# Patient Record
Sex: Male | Born: 1955 | Race: White | Hispanic: No | Marital: Single | Smoking: Former smoker
Health system: Southern US, Community
[De-identification: ages and names within clinical notes are randomized; demographics above are authoritative.]

## PROBLEM LIST (undated history)

## (undated) DIAGNOSIS — I10 Essential (primary) hypertension: Secondary | ICD-10-CM

## (undated) DIAGNOSIS — E119 Type 2 diabetes mellitus without complications: Secondary | ICD-10-CM

## (undated) DIAGNOSIS — J449 Chronic obstructive pulmonary disease, unspecified: Secondary | ICD-10-CM

## (undated) HISTORY — DX: Chronic obstructive pulmonary disease, unspecified: J44.9

---

## 2017-02-21 ENCOUNTER — Emergency Department (HOSPITAL_COMMUNITY)
Admission: EM | Admit: 2017-02-21 | Discharge: 2017-02-21 | Disposition: A | Discharge status: Home / Self Care | Payer: Medicare Other | Attending: Emergency Medicine | Admitting: Emergency Medicine

## 2017-02-21 ENCOUNTER — Encounter (HOSPITAL_COMMUNITY): Payer: Self-pay

## 2017-02-21 ENCOUNTER — Emergency Department (HOSPITAL_COMMUNITY): Payer: Medicare Other

## 2017-02-21 DIAGNOSIS — Z87891 Personal history of nicotine dependence: Secondary | ICD-10-CM | POA: Insufficient documentation

## 2017-02-21 DIAGNOSIS — Y999 Unspecified external cause status: Secondary | ICD-10-CM | POA: Diagnosis not present

## 2017-02-21 DIAGNOSIS — E119 Type 2 diabetes mellitus without complications: Secondary | ICD-10-CM | POA: Insufficient documentation

## 2017-02-21 DIAGNOSIS — Z79899 Other long term (current) drug therapy: Secondary | ICD-10-CM | POA: Diagnosis not present

## 2017-02-21 DIAGNOSIS — S40011A Contusion of right shoulder, initial encounter: Secondary | ICD-10-CM | POA: Insufficient documentation

## 2017-02-21 DIAGNOSIS — Y9301 Activity, walking, marching and hiking: Secondary | ICD-10-CM | POA: Insufficient documentation

## 2017-02-21 DIAGNOSIS — I10 Essential (primary) hypertension: Secondary | ICD-10-CM | POA: Insufficient documentation

## 2017-02-21 DIAGNOSIS — W01198A Fall on same level from slipping, tripping and stumbling with subsequent striking against other object, initial encounter: Secondary | ICD-10-CM | POA: Diagnosis not present

## 2017-02-21 DIAGNOSIS — Y929 Unspecified place or not applicable: Secondary | ICD-10-CM | POA: Insufficient documentation

## 2017-02-21 DIAGNOSIS — S4991XA Unspecified injury of right shoulder and upper arm, initial encounter: Secondary | ICD-10-CM | POA: Diagnosis present

## 2017-02-21 DIAGNOSIS — W010XXA Fall on same level from slipping, tripping and stumbling without subsequent striking against object, initial encounter: Secondary | ICD-10-CM

## 2017-02-21 HISTORY — DX: Essential (primary) hypertension: I10

## 2017-02-21 HISTORY — DX: Type 2 diabetes mellitus without complications: E11.9

## 2017-02-21 NOTE — Discharge Instructions (Signed)
Apply ice several times a day. Take acetaminophen or ibuprofen as needed for pain.  Twice a day, put the shoulder through as full a range of motion as you can. This is to prevent a frozen shoulder.  See Dr. Margo AyeHall to arrange for physical therapy.

## 2017-02-21 NOTE — ED Triage Notes (Signed)
Pt states he slipped on wet ground on Tuesday morning, fell onto his right shoulder, states pain with abduction of same

## 2017-02-21 NOTE — ED Provider Notes (Signed)
[facility] EMERGENCY DEPARTMENT Provider Note   CSN: [id number] Arrival date & time: 02/21/17  0351     History   Chief Complaint Chief Complaint  Patient presents with  . Shoulder Injury    fall    HPI Saintclair HalstedWilliam Dominguez is a 61 y.o. male.  The history is provided by the patient.  Yesterday, he slipped on wet ground and fell landing on his right shoulder. He is complaining of pain in that shoulder since then. He denies other injury. He is not on any anticoagulants. He complains of pain in his right shoulder when he tries to move it. There is no pain at rest. He initially had very poor strength in his right hand, but that is come back to normal. He denies numbness or tingling.  Past Medical History:  Diagnosis Date  . Diabetes mellitus without complication (HCC)   . Hypertension     There are no active problems to display for this patient.   History reviewed. No pertinent surgical history.     Home Medications    Prior to Admission medications   Medication Sig Start Date End Date Taking? Authorizing Provider  lisinopril (PRINIVIL,ZESTRIL) 10 MG tablet Take 10 mg by mouth daily.   Yes [provider]  metoprolol succinate (TOPROL-XL) 50 MG 24 hr tablet Take 50 mg by mouth daily. Take with or immediately following a meal.   Yes [provider]    Family History No family history on file.  Social History Social History  Substance Use Topics  . Smoking status: Former Games developermoker  . Smokeless tobacco: Never Used  . Alcohol use No     Allergies   Patient has no known allergies.   Review of Systems Review of Systems  All other systems reviewed and are negative.    Physical Exam Updated Vital Signs BP (!) 155/95 (BP Location: Left Arm)   Pulse 76   Temp 97.6 F (36.4 C) (Oral)   Resp 20   Ht 5\' 11"  (1.803 m)   Wt 111.1 kg (245 lb)   SpO2 99%   BMI 34.17 kg/m   Physical Exam  Nursing note and vitals reviewed.  61 year old male,  resting comfortably and in no acute distress. Vital signs are significant for hypertension. Oxygen saturation is 99%, which is normal. Head is normocephalic and atraumatic. PERRLA, EOMI. Oropharynx is clear. Neck is nontender and supple without adenopathy or JVD. Back is nontender and there is no CVA tenderness. Lungs are clear without rales, wheezes, or rhonchi. Chest is nontender. Heart has regular rate and rhythm without murmur. Abdomen is soft, flat, nontender without masses or hepatosplenomegaly and peristalsis is normoactive. Extremities: No swelling or deformity of the right shoulder. Minimal tenderness to palpation. There is moderate pain on passive range of motion. Range of motion is mildly limited in all directions. Neurovascular exam is intact with strong radial pulse, normal sensation, normal strength, prompt capillary refill. Skin is warm and dry without rash. Neurologic: Mental status is normal, cranial nerves are intact, there are no motor or sensory deficits.  ED Treatments / Results   Radiology Dg Shoulder Right  Result Date: 02/21/2017 CLINICAL DATA:  Slip and fall injury on Tuesday morning. Pain with abduction. EXAM: RIGHT SHOULDER - 2+ VIEW COMPARISON:  None. FINDINGS: There is no evidence of fracture or dislocation. There is no evidence of arthropathy or other focal bone abnormality. Soft tissues are unremarkable. IMPRESSION: Negative. Electronically Signed   By: Marisa CyphersWilliam  Stevens M.D.  On: 02/21/2017 04:39    Procedures Procedures (including critical care time)  Medications Ordered in ED Medications - No data to display   Initial Impression / Assessment and Plan / ED Course  I have reviewed the triage vital signs and the nursing notes.  Pertinent imaging results that were available during my care of the patient were reviewed by me and considered in my medical decision making (see chart for details).  Fall with injury to right shoulder. X-rays are negative for  fracture or dislocation. He is placed in a sling for comfort, advised on icing the shoulder several times a day. Recommended he put the shoulder through has full range of motion disease able to tolerate at least twice a day. Follow-up with PCP to consider physical therapy. Told to use over-the-counter analgesics as needed for pain. He has no prior visits and the [facility] system.  Final Clinical Impressions(s) / ED Diagnoses   Final diagnoses:  Fall from slipping, initial encounter  Contusion of right shoulder, initial encounter    New Prescriptions New Prescriptions   No medications on file     Dione Booze, MD 02/21/17 [phone]

## 2017-11-23 ENCOUNTER — Emergency Department (HOSPITAL_COMMUNITY): Payer: Medicare Other

## 2017-11-23 ENCOUNTER — Other Ambulatory Visit: Payer: Self-pay

## 2017-11-23 ENCOUNTER — Emergency Department (HOSPITAL_COMMUNITY)
Admission: EM | Admit: 2017-11-23 | Discharge: 2017-11-23 | Disposition: A | Discharge status: Home / Self Care | Payer: Medicare Other | Attending: Emergency Medicine | Admitting: Emergency Medicine

## 2017-11-23 ENCOUNTER — Encounter (HOSPITAL_COMMUNITY): Payer: Self-pay | Admitting: Emergency Medicine

## 2017-11-23 DIAGNOSIS — Y929 Unspecified place or not applicable: Secondary | ICD-10-CM | POA: Insufficient documentation

## 2017-11-23 DIAGNOSIS — X500XXA Overexertion from strenuous movement or load, initial encounter: Secondary | ICD-10-CM | POA: Diagnosis not present

## 2017-11-23 DIAGNOSIS — Y998 Other external cause status: Secondary | ICD-10-CM | POA: Insufficient documentation

## 2017-11-23 DIAGNOSIS — I10 Essential (primary) hypertension: Secondary | ICD-10-CM | POA: Diagnosis not present

## 2017-11-23 DIAGNOSIS — Y9389 Activity, other specified: Secondary | ICD-10-CM | POA: Insufficient documentation

## 2017-11-23 DIAGNOSIS — Z87891 Personal history of nicotine dependence: Secondary | ICD-10-CM | POA: Insufficient documentation

## 2017-11-23 DIAGNOSIS — S4992XA Unspecified injury of left shoulder and upper arm, initial encounter: Secondary | ICD-10-CM | POA: Diagnosis present

## 2017-11-23 DIAGNOSIS — S46211A Strain of muscle, fascia and tendon of other parts of biceps, right arm, initial encounter: Secondary | ICD-10-CM | POA: Diagnosis not present

## 2017-11-23 DIAGNOSIS — E119 Type 2 diabetes mellitus without complications: Secondary | ICD-10-CM | POA: Insufficient documentation

## 2017-11-23 DIAGNOSIS — S46219A Strain of muscle, fascia and tendon of other parts of biceps, unspecified arm, initial encounter: Secondary | ICD-10-CM

## 2017-11-23 LAB — COMPREHENSIVE METABOLIC PANEL
ALK PHOS: 47 U/L (ref 38–126)
ALT: 42 U/L (ref 0–44)
AST: 35 U/L (ref 15–41)
Albumin: 3.8 g/dL (ref 3.5–5.0)
Anion gap: 9 (ref 5–15)
BILIRUBIN TOTAL: 0.8 mg/dL (ref 0.3–1.2)
BUN: 8 mg/dL (ref 8–23)
CALCIUM: 8.3 mg/dL — AB (ref 8.9–10.3)
CO2: 22 mmol/L (ref 22–32)
Chloride: 99 mmol/L (ref 98–111)
Creatinine, Ser: 0.87 mg/dL (ref 0.61–1.24)
GFR calc Af Amer: >60 mL/min (ref >60)
GLUCOSE: 180 mg/dL — AB (ref 70–99)
POTASSIUM: 4.6 mmol/L (ref 3.5–5.1)
Sodium: 130 mmol/L — ABNORMAL LOW (ref 135–145)
TOTAL PROTEIN: 6.6 g/dL (ref 6.5–8.1)

## 2017-11-23 LAB — LACTIC ACID, PLASMA
LACTIC ACID, VENOUS: 2.5 mmol/L — AB (ref 0.5–1.9)
LACTIC ACID, VENOUS: 1.6 mmol/L (ref 0.5–1.9)

## 2017-11-23 LAB — CBC WITH DIFFERENTIAL/PLATELET
BASOS ABS: 0 10*3/uL (ref 0.0–0.1)
Basophils Relative: 0 %
EOS ABS: 0 10*3/uL (ref 0.0–0.7)
Eosinophils Relative: 0 %
HCT: 41.1 % (ref 39.0–52.0)
Hemoglobin: 14.1 g/dL (ref 13.0–17.0)
Lymphocytes Relative: 12 %
Lymphs Abs: 1.2 10*3/uL (ref 0.7–4.0)
MCH: 29.3 pg (ref 26.0–34.0)
MCHC: 34.3 g/dL (ref 30.0–36.0)
MCV: 85.4 fL (ref 78.0–100.0)
MONO ABS: 0.6 10*3/uL (ref 0.1–1.0)
MONOS PCT: 6 %
NEUTROS ABS: 8.6 10*3/uL — AB (ref 1.7–7.7)
NEUTROS PCT: 82 %
PLATELETS: 184 10*3/uL (ref 150–400)
RBC: 4.81 MIL/uL (ref 4.22–5.81)
RDW: 13.8 % (ref 11.5–15.5)
WBC: 10.5 10*3/uL (ref 4.0–10.5)

## 2017-11-23 LAB — CK: CK TOTAL: 160 U/L (ref 49–397)

## 2017-11-23 MED ORDER — FENTANYL CITRATE (PF) 100 MCG/2ML IJ SOLN
50.0000 ug | Freq: Once | INTRAMUSCULAR | Status: AC
  Administered 2017-11-23: 50 ug via INTRAVENOUS
  Filled 2017-11-23: qty 2

## 2017-11-23 MED ORDER — IOPAMIDOL (ISOVUE-370) INJECTION 76%
100.0000 mL | Freq: Once | INTRAVENOUS | Status: AC | PRN
  Administered 2017-11-23: 100 mL via INTRAVENOUS

## 2017-11-23 MED ORDER — OXYCODONE-ACETAMINOPHEN 5-325 MG PO TABS: 1.0000 | ORAL_TABLET | Freq: Four times a day (QID) | ORAL | 0 refills | Status: DC | PRN

## 2017-11-23 NOTE — ED Notes (Signed)
Date and time results received: 11/23/17 [id number]  Test: Lactic Critical Value: 2.5  Name of Provider Notified: Lynelle DoctorKnapp  Orders Received? Or Actions Taken?: n/a
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
## 2017-11-23 NOTE — ED Notes (Signed)
Pt ambulatory to bathroom.  Denies any needs.

## 2017-11-23 NOTE — Discharge Instructions (Signed)
Elevate your shoulder/upper arm. Use ice packs over the swollen area. Wear the sling for comfort. Take the medication as prescribed. Please call the radiology schedule line on Monday, July 22, at [phone] to schedule your outpatient MRI. Call Dr Mort SawyersHarrison's office on Monday, July 22 to get an appointment to see him sometime after your MRI is scheduled.  Return to the ED if your left fingers get painful, numb or cold to touch or your pain or swelling seems worse.
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
## 2019-07-17 ENCOUNTER — Ambulatory Visit: Payer: Medicare Other | Attending: Internal Medicine

## 2019-07-17 DIAGNOSIS — Z23 Encounter for immunization: Secondary | ICD-10-CM

## 2019-07-17 NOTE — Progress Notes (Signed)
   Covid-19 Vaccination Clinic  Name:  Alexander Dominguez    MRN: [id number] DOB: 1956/04/01  07/17/2019  [name] was observed post Covid-19 immunization for 15 minutes without incident. He was provided with Vaccine Information Sheet and instruction to access the V-Safe system.   [name] was instructed to call 911 with any severe reactions post vaccine: Marland Kitchen Difficulty breathing  . Swelling of face and throat  . A fast heartbeat  . A bad rash all over body  . Dizziness and weakness   Immunizations Administered    Name Date Dose VIS Date Route   Moderna COVID-19 Vaccine 07/17/2019 10:52 AM 0.5 mL 04/07/2019 Intramuscular   Manufacturer: Moderna   Lot: [id number]   NDC: 16606-301-60

## 2019-08-19 ENCOUNTER — Ambulatory Visit: Payer: Medicare Other | Attending: Internal Medicine

## 2019-08-19 DIAGNOSIS — Z23 Encounter for immunization: Secondary | ICD-10-CM

## 2019-08-19 NOTE — Progress Notes (Signed)
   Covid-19 Vaccination Clinic  Name:  Filimon Miranda    MRN: [id number] DOB: 03/08/1956  08/19/2019  Mr. Pietrzak was observed post Covid-19 immunization for 15 minutes without incident. He was provided with Vaccine Information Sheet and instruction to access the V-Safe system.   Mr. Crisanto was instructed to call 911 with any severe reactions post vaccine: Marland Kitchen Difficulty breathing  . Swelling of face and throat  . A fast heartbeat  . A bad rash all over body  . Dizziness and weakness   Immunizations Administered    Name Date Dose VIS Date Route   Moderna COVID-19 Vaccine 08/19/2019  8:56 AM 0.5 mL 04/07/2019 Intramuscular   Manufacturer: Moderna   Lot: [id number]   NDC: 55001-642-90

## 2019-11-10 ENCOUNTER — Other Ambulatory Visit: Payer: Self-pay

## 2019-11-10 ENCOUNTER — Encounter: Payer: Self-pay | Admitting: Vascular Surgery

## 2019-11-10 ENCOUNTER — Ambulatory Visit (INDEPENDENT_AMBULATORY_CARE_PROVIDER_SITE_OTHER): Payer: Medicare Other | Admitting: Vascular Surgery

## 2019-11-10 DIAGNOSIS — I723 Aneurysm of iliac artery: Secondary | ICD-10-CM

## 2019-11-10 NOTE — Progress Notes (Signed)
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  Patient describes about 1 month of pain in his left inner thigh that prompted a CT scan.  He states this pain is worse when walking and gets better when he rests for 2 or 3 days.  He is not having any associated abdominal pain.  He does a lot of hiking and nature photography.  Ultimately got a CT scan with his PCP on 10/07/19 with notes of a mild aneurysmal dilation of the left common iliac artery measuring 17 mm.  Patient denies any known history of aneurysmal disease or family history of aneurysmal disease.  Past Medical History:  Diagnosis Date  . COPD (chronic obstructive pulmonary disease) (HCC)   . Diabetes mellitus without complication (HCC)   . Hypertension     History reviewed. No pertinent surgical history.  Family History  Problem Relation Age of Onset  . Heart disease Mother   . Heart disease Father   . COPD Father     SOCIAL HISTORY: Social History   Socioeconomic History  . Marital status: Single    Spouse name: Not on file  . Number of children: Not on file  . Years of education: Not on file  . Highest education level: Not on file  Occupational History  . Not on file  Tobacco Use  . Smoking status: Former Games developer  . Smokeless tobacco: Never Used  Substance and Sexual Activity  . Alcohol use: No  . Drug use: No  . Sexual activity: Not on file  Other Topics Concern  . Not on file  Social History Narrative  . Not on file   Social Determinants of Health   Financial Resource Strain:   . Difficulty of Paying Living Expenses:   Food Insecurity:   . Worried About Programme researcher, broadcasting/film/video in the Last Year:   . Barista in the Last Year:   Transportation Needs:     . Freight forwarder (Medical):   Marland Kitchen Lack of Transportation (Non-Medical):   Physical Activity:   . Days of Exercise per Week:   . Minutes of Exercise per Session:   Stress:   . Feeling of Stress :   Social Connections:   . Frequency of Communication with Friends and Family:   . Frequency of Social Gatherings with Friends and Family:   . Attends Religious Services:   . Active Member of Clubs or Organizations:   . Attends Banker Meetings:   Marland Kitchen Marital Status:   Intimate Partner Violence:   . Fear of Current or Ex-Partner:   . Emotionally Abused:   Marland Kitchen Physically Abused:   . Sexually Abused:     Allergies  Allergen Reactions  . Other Cough, Itching and Shortness Of Breath  . Metformin And Related Diarrhea    Current Outpatient Medications  Medication Sig Dispense Refill  . amLODipine (NORVASC) 10 MG tablet Take 10 mg by mouth daily.    Marland Kitchen glipiZIDE (GLUCOTROL) 10 MG tablet Take 10 mg by mouth daily.    Marland Kitchen lisinopril (PRINIVIL,ZESTRIL) 10 MG tablet Take 10 mg by mouth daily.    . metoprolol succinate (TOPROL-XL) 50 MG 24 hr tablet Take 50 mg by mouth daily. Take with or immediately following a  meal.    . oxyCODONE-acetaminophen (PERCOCET/ROXICET) 5-325 MG tablet Take 1 tablet by mouth every 6 (six) hours as needed for severe pain. 15 tablet 0   No current facility-administered medications for this visit.    REVIEW OF SYSTEMS:  [X]  denotes positive finding, [ ]  denotes negative finding Cardiac  Comments:  Chest pain or chest pressure:    Shortness of breath upon exertion:    Short of breath when lying flat:    Irregular heart rhythm:        Vascular    Pain in calf, thigh, or hip brought on by ambulation: x Left inner thigh  Pain in feet at night that wakes you up from your sleep:     Blood clot in your veins:    Leg swelling:         Pulmonary    Oxygen at home:    Productive cough:     Wheezing:         Neurologic    Sudden weakness in arms or  legs:     Sudden numbness in arms or legs:     Sudden onset of difficulty speaking or slurred speech:    Temporary loss of vision in one eye:     Problems with dizziness:         Gastrointestinal    Blood in stool:     Vomited blood:         Genitourinary    Burning when urinating:     Blood in urine:        Psychiatric    Major depression:         Hematologic    Bleeding problems:    Problems with blood clotting too easily:        Skin    Rashes or ulcers:        Constitutional    Fever or chills:      PHYSICAL EXAM: Vitals:   11/10/19 1037  BP: 112/73  Pulse: 81  Resp: 16  Temp: 97.7 F (36.5 C)  TempSrc: Temporal  SpO2: 98%  Weight: 246 lb (111.6 kg)  Height: 5\' 10"  (1.778 m)    GENERAL: The patient is a well-nourished male, in no acute distress. The vital signs are documented above. CARDIAC: There is a regular rate and rhythm.  VASCULAR:  Palpable femoral pulses bilateral groins Palpable dorsalis pedis pulses bilaterally PULMONARY: There is good air exchange bilaterally without wheezing or rales. ABDOMEN: Soft and non-tender with normal pitched bowel sounds.  No abdominal pain. MUSCULOSKELETAL: There are no major deformities or cyanosis. NEUROLOGIC: No focal weakness or paresthesias are detected. SKIN: There are no ulcers or rashes noted. PSYCHIATRIC: The patient has a normal affect.  DATA:   I independently reviewed CT abdomen pelvis and there is a mildly ectatic left common iliac artery that measures 17 mm whereas his normal right common iliac artery measures 14 mm.  Assessment/Plan:  64 year old male referred for evaluation of left common iliac artery aneurysm.  Discussed with the patient in detail that this is not really an aneurysm and is really just an ectatic common iliac artery.  His normal right common iliac artery measures 14 mm therefore the left one is only mildly ectatic at 17 mm.  No role for surgical intervention.  Discussed the current  standard of care for intervention on iliac artery aneurysms usually greater than 3 to 3-1/2 cm.  I do not see any thrombus or dissection or other concerning findings with  this ectatic segment of left common iliac artery.  He has easily palpable left femoral and pedal pulses.  I will have him follow-up in 1 year with aortoiliac duplex for surveillance but otherwise do not feel he needs any other intervention at this time.  In addition do not feel this is any relation to the pain he is having in his left inner thigh that sounds more musculoskeletal.   Cephus Shelling, MD [facility]: [phone]

## 2020-03-15 ENCOUNTER — Encounter: Payer: Self-pay | Admitting: Internal Medicine

## 2020-05-04 ENCOUNTER — Ambulatory Visit: Payer: Medicare Other | Admitting: Internal Medicine

## 2020-06-02 ENCOUNTER — Encounter: Payer: Self-pay | Admitting: *Deleted

## 2020-06-02 ENCOUNTER — Ambulatory Visit (INDEPENDENT_AMBULATORY_CARE_PROVIDER_SITE_OTHER): Payer: Medicare Other | Admitting: Internal Medicine

## 2020-06-02 ENCOUNTER — Encounter: Payer: Self-pay | Admitting: Internal Medicine

## 2020-06-02 ENCOUNTER — Other Ambulatory Visit: Payer: Self-pay

## 2020-06-02 VITALS — BP 124/79 | HR 72 | Temp 96.9°F | Ht 70.0 in | Wt 239.4 lb

## 2020-06-02 DIAGNOSIS — R945 Abnormal results of liver function studies: Secondary | ICD-10-CM

## 2020-06-02 DIAGNOSIS — K900 Celiac disease: Secondary | ICD-10-CM

## 2020-06-02 DIAGNOSIS — R7989 Other specified abnormal findings of blood chemistry: Secondary | ICD-10-CM

## 2020-06-02 DIAGNOSIS — K769 Liver disease, unspecified: Secondary | ICD-10-CM | POA: Diagnosis not present

## 2020-06-02 NOTE — Progress Notes (Signed)
Primary Care Physician:  Suzan Slick, MD Primary Gastroenterologist:  Dr. Marletta Lor  Chief Complaint  Patient presents with  . Celiac Disease    Following gluten free diet    HPI:   Alexander Dominguez is a 65 y.o. male who presents to the clinic today by referral from Dr. Jeronimo Greaves for evaluation for celiac disease.  Patient suffered a fall in October 2021 with broken hip which was surgically repaired.  During his hospital stay he was experiencing diarrhea with up to 20 loose bowel movements daily.  This was further worked up with EGD that showed scalloping of the duodenum.  Subsequent biopsies were positive for celiac sprue findings.  TTG also significantly elevated above 100.  Does note family history of celiac disease in his mother.  Since that time he has been on a gluten-free diet and states his symptoms are vastly improved.  No longer having chronic diarrhea.  Has 1-2 normal bowel movements a day.  He is very thorough as far as avoiding gluten and uses an app on his phone to do so.  Also at some point during his hospitalization CT imaging noted a nodular appearance of his liver.  His aminotransferases were very mildly elevated though have normalized since then.  Patient denies any alcohol use.  No family history of chronic liver disease.  As far as colon cancer screening, he had a negative Cologuard 12/03/2018  Past Medical History:  Diagnosis Date  . COPD (chronic obstructive pulmonary disease) (HCC)   . Diabetes mellitus without complication (HCC)   . Hypertension     No past surgical history on file.  Current Outpatient Medications  Medication Sig Dispense Refill  . amLODipine (NORVASC) 10 MG tablet Take 10 mg by mouth daily.    . cholecalciferol (VITAMIN D3) 25 MCG (1000 UNIT) tablet Take 2,000 Units by mouth once a week.    Marland Kitchen glipiZIDE (GLUCOTROL) 10 MG tablet Take 10 mg by mouth daily.    Marland Kitchen lisinopril (ZESTRIL) 40 MG tablet Take 40 mg by mouth daily.    . metoprolol  succinate (TOPROL-XL) 50 MG 24 hr tablet Take 50 mg by mouth in the morning and at bedtime. Take with or immediately following a meal.    . oxyCODONE-acetaminophen (PERCOCET/ROXICET) 5-325 MG tablet Take 1 tablet by mouth every 6 (six) hours as needed for severe pain. 15 tablet 0   No current facility-administered medications for this visit.    Allergies as of 06/02/2020 - Review Complete 06/02/2020  Allergen Reaction Noted  . Other Cough, Itching, and Shortness Of Breath 05/22/2018  . Metformin and related Diarrhea 11/21/2018    Family History  Problem Relation Age of Onset  . Heart disease Mother   . Heart disease Father   . COPD Father     Social History   Socioeconomic History  . Marital status: Single    Spouse name: Not on file  . Number of children: Not on file  . Years of education: Not on file  . Highest education level: Not on file  Occupational History  . Not on file  Tobacco Use  . Smoking status: Former Games developer  . Smokeless tobacco: Never Used  Substance and Sexual Activity  . Alcohol use: No  . Drug use: No  . Sexual activity: Not on file  Other Topics Concern  . Not on file  Social History Narrative  . Not on file   Social Determinants of Health   Financial Resource Strain: Not on  file  Food Insecurity: Not on file  Transportation Needs: Not on file  Physical Activity: Not on file  Stress: Not on file  Social Connections: Not on file  Intimate Partner Violence: Not on file    Subjective: Review of Systems  Constitutional: Negative for chills and fever.  HENT: Negative for congestion and hearing loss.   Eyes: Negative for blurred vision and double vision.  Respiratory: Negative for cough and shortness of breath.   Cardiovascular: Negative for chest pain and palpitations.  Gastrointestinal: Negative for abdominal pain, blood in stool, constipation, diarrhea, heartburn, melena and vomiting.  Genitourinary: Negative for dysuria and urgency.   Musculoskeletal: Negative for joint pain and myalgias.  Skin: Negative for itching and rash.  Neurological: Negative for dizziness and headaches.  Psychiatric/Behavioral: Negative for depression. The patient is not nervous/anxious.        Objective: BP 124/79   Pulse 72   Temp (!) 96.9 F (36.1 C)   Ht 5\' 10"  (1.778 m)   Wt 239 lb 6.4 oz (108.6 kg)   BMI 34.35 kg/m  Physical Exam Constitutional:      Appearance: Normal appearance. He is obese.  HENT:     Head: Normocephalic and atraumatic.  Eyes:     Extraocular Movements: Extraocular movements intact.     Conjunctiva/sclera: Conjunctivae normal.  Cardiovascular:     Rate and Rhythm: Normal rate and regular rhythm.  Pulmonary:     Effort: Pulmonary effort is normal.     Breath sounds: Normal breath sounds.  Abdominal:     General: Bowel sounds are normal.     Palpations: Abdomen is soft.  Musculoskeletal:        General: Normal range of motion.     Cervical back: Normal range of motion and neck supple.  Skin:    General: Skin is warm.  Neurological:     General: No focal deficit present.     Mental Status: He is alert and oriented to person, place, and time.  Psychiatric:        Mood and Affect: Mood normal.        Behavior: Behavior normal.      Assessment: *Celiac disease *Chronic liver disease *Obesity  Plan: Discussed celiac disease in depth with patient today.  He has both biopsy-proven as well as elevated TTG confirming diagnosis.  He has been on gluten-free diet since November 2021.  We will tentatively plan on repeat TTG testing in May.  Also discussed that we will likely need to perform EGD at that time as well.  Continue on gluten-free diet.  Patient seems like he is adhering to this very well.  In regards to his nodular liver seen on CT imaging, we will order right upper quadrant ultrasound with elastography to further evaluate.  We may perform full serological work-up depending on these findings.   He did have viral hepatitis testing which was negative.  Has risk factors for fatty liver disease including obesity and diabetes.  Further recommendations to follow.  06/02/2020 4:22 PM   Disclaimer: This note was dictated with voice recognition software. Similar sounding words can inadvertently be transcribed and may not be corrected upon review.

## 2020-06-02 NOTE — Patient Instructions (Signed)
We will schedule you for right upper quadrant ultrasound with elastography to further evaluate for chronic liver disease.  I will plan on blood work in May when you come back to see me for follow-up visit.  Continue on gluten-free diet.  At [facility] we value your feedback. You may receive a survey about your visit today. Please share your experience as we strive to create trusting relationships with our patients to provide genuine, compassionate, quality care.  We appreciate your understanding and patience as we review any laboratory studies, imaging, and other diagnostic tests that are ordered as we care for you. Our office policy is 5 business days for review of these results, and any emergent or urgent results are addressed in a timely manner for your best interest. If you do not hear from our office in 1 week, please contact us.   We also encourage the use of MyChart, which contains your medical information for your review as well. If you are not enrolled in this feature, an access code is on this after visit summary for your convenience. Thank you for allowing [place] to be involved in your care.  It was great to see you today!  I hope you have a great rest of your winter!!    Hennie Duos. Marletta Lor, D.O. Gastroenterology and Hepatology [facility]

## 2020-06-08 ENCOUNTER — Other Ambulatory Visit: Payer: Self-pay

## 2020-06-08 ENCOUNTER — Ambulatory Visit (HOSPITAL_COMMUNITY): Payer: Medicare Other

## 2020-06-08 ENCOUNTER — Ambulatory Visit (HOSPITAL_COMMUNITY)
Admission: RE | Admit: 2020-06-08 | Discharge: 2020-06-08 | Disposition: A | Discharge status: Home / Self Care | Payer: Medicare Other | Source: Ambulatory Visit | Attending: Internal Medicine | Admitting: Internal Medicine

## 2020-06-08 DIAGNOSIS — R945 Abnormal results of liver function studies: Secondary | ICD-10-CM | POA: Diagnosis present

## 2020-06-08 DIAGNOSIS — K769 Liver disease, unspecified: Secondary | ICD-10-CM | POA: Diagnosis present

## 2020-06-08 DIAGNOSIS — R7989 Other specified abnormal findings of blood chemistry: Secondary | ICD-10-CM

## 2020-07-26 ENCOUNTER — Encounter: Payer: Self-pay | Admitting: Internal Medicine

## 2020-09-07 ENCOUNTER — Ambulatory Visit (INDEPENDENT_AMBULATORY_CARE_PROVIDER_SITE_OTHER): Payer: Medicare Other | Admitting: Internal Medicine

## 2020-09-07 ENCOUNTER — Telehealth: Payer: Self-pay | Admitting: *Deleted

## 2020-09-07 ENCOUNTER — Encounter: Payer: Self-pay | Admitting: Internal Medicine

## 2020-09-07 ENCOUNTER — Other Ambulatory Visit: Payer: Self-pay

## 2020-09-07 VITALS — BP 138/84 | HR 85 | Temp 97.5°F | Ht 71.0 in | Wt 249.6 lb

## 2020-09-07 DIAGNOSIS — K900 Celiac disease: Secondary | ICD-10-CM | POA: Diagnosis not present

## 2020-09-07 DIAGNOSIS — K760 Fatty (change of) liver, not elsewhere classified: Secondary | ICD-10-CM | POA: Diagnosis not present

## 2020-09-07 NOTE — Progress Notes (Signed)
Referring Provider: Suzan Slick, MD Primary Care Physician:  Suzan Slick, MD Primary GI:  Dr. Marletta Lor  Chief Complaint  Patient presents with  . Follow-up    F/U celiac disease    HPI:   Alexander Dominguez is a 65 y.o. male who presents to the clinic today for follow-up visit. Patient suffered a fall in October 2021 with broken hip which was surgically repaired.  During his hospital stay he was experiencing diarrhea with up to 20 loose bowel movements daily.  This was further worked up with EGD that showed scalloping of the duodenum.  Subsequent biopsies were positive for celiac sprue findings.  TTG also significantly elevated above 100.  Does note family history of celiac disease in his mother.  Since that time he has been on a gluten-free diet and states his symptoms are vastly improved.  No longer having chronic diarrhea.  Has 1-2 normal bowel movements a day.  He is very thorough as far as avoiding gluten and uses an app on his phone to do so.  Also at some point during his hospitalization CT imaging noted a nodular appearance of his liver.  His aminotransferases were very mildly elevated though have normalized since then.  Patient denies any alcohol use.  No family history of chronic liver disease.  He underwent elastography with a median kPA of 2.7.  As far as colon cancer screening, he had a negative Cologuard 12/03/2018  Past Medical History:  Diagnosis Date  . COPD (chronic obstructive pulmonary disease) (HCC)   . Diabetes mellitus without complication (HCC)   . Hypertension     No past surgical history on file.  Current Outpatient Medications  Medication Sig Dispense Refill  . amLODipine (NORVASC) 10 MG tablet Take 10 mg by mouth daily.    . cholecalciferol (VITAMIN D3) 25 MCG (1000 UNIT) tablet Take 50,000 Units by mouth once a week.    Marland Kitchen glipiZIDE (GLUCOTROL) 10 MG tablet Take 10 mg by mouth daily.    Marland Kitchen lisinopril (ZESTRIL) 40 MG tablet Take 40 mg by mouth  daily.    . metoprolol succinate (TOPROL-XL) 50 MG 24 hr tablet Take 50 mg by mouth in the morning and at bedtime. Take with or immediately following a meal.    . oxyCODONE-acetaminophen (PERCOCET/ROXICET) 5-325 MG tablet Take 1 tablet by mouth every 6 (six) hours as needed for severe pain. (Patient not taking: Reported on 09/07/2020) 15 tablet 0   No current facility-administered medications for this visit.    Allergies as of 09/07/2020 - Review Complete 09/07/2020  Allergen Reaction Noted  . Other Cough, Itching, and Shortness Of Breath 05/22/2018  . Metformin and related Diarrhea 11/21/2018    Family History  Problem Relation Age of Onset  . Heart disease Mother   . Heart disease Father   . COPD Father     Social History   Socioeconomic History  . Marital status: Single    Spouse name: Not on file  . Number of children: Not on file  . Years of education: Not on file  . Highest education level: Not on file  Occupational History  . Not on file  Tobacco Use  . Smoking status: Former Games developer  . Smokeless tobacco: Never Used  Substance and Sexual Activity  . Alcohol use: No  . Drug use: No  . Sexual activity: Not on file  Other Topics Concern  . Not on file  Social History Narrative  . Not on file  Social Determinants of Health   Financial Resource Strain: Not on file  Food Insecurity: Not on file  Transportation Needs: Not on file  Physical Activity: Not on file  Stress: Not on file  Social Connections: Not on file    Subjective: Review of Systems  Constitutional: Negative for chills and fever.  HENT: Negative for congestion and hearing loss.   Eyes: Negative for blurred vision and double vision.  Respiratory: Negative for cough and shortness of breath.   Cardiovascular: Negative for chest pain and palpitations.  Gastrointestinal: Negative for abdominal pain, blood in stool, constipation, diarrhea, heartburn, melena and vomiting.  Genitourinary: Negative for  dysuria and urgency.  Musculoskeletal: Negative for joint pain and myalgias.  Skin: Negative for itching and rash.  Neurological: Negative for dizziness and headaches.  Psychiatric/Behavioral: Negative for depression. The patient is not nervous/anxious.      Objective: BP 138/84   Pulse 85   Temp (!) 97.5 F (36.4 C) (Temporal)   Ht 5\' 11"  (1.803 m)   Wt 249 lb 9.6 oz (113.2 kg)   BMI 34.81 kg/m  Physical Exam Constitutional:      Appearance: Normal appearance. He is obese.  HENT:     Head: Normocephalic and atraumatic.  Eyes:     Extraocular Movements: Extraocular movements intact.     Conjunctiva/sclera: Conjunctivae normal.  Cardiovascular:     Rate and Rhythm: Normal rate and regular rhythm.  Pulmonary:     Effort: Pulmonary effort is normal.     Breath sounds: Normal breath sounds.  Abdominal:     General: Bowel sounds are normal.     Palpations: Abdomen is soft.  Musculoskeletal:        General: Normal range of motion.     Cervical back: Normal range of motion and neck supple.  Skin:    General: Skin is warm.  Neurological:     General: No focal deficit present.     Mental Status: He is alert and oriented to person, place, and time.  Psychiatric:        Mood and Affect: Mood normal.        Behavior: Behavior normal.      Assessment: *Celiac disease *Fatty liver disease  Plan: Discussed celiac disease in depth with patient today.  He has both biopsy-proven as well as elevated TTG confirming diagnosis.  He has been on gluten-free diet since November 2021.  We will check TTG level today in clinic.  Recommended repeat EGD as well and patient declines.  Counseled on importance of gluten-free diet.  Patient appears to be adhering to this well.  Recommend 1-2# weight loss per week until ideal body weight through exercise & diet. Low fat/cholesterol diet.   Avoid sweets, sodas, fruit juices, sweetened beverages like tea, etc. Gradually increase exercise  from 15 min daily up to 1 hr per day 5 days/week. Limit alcohol use.  Follow-up in 1 year or sooner if needed  09/07/2020 11:17 AM   Disclaimer: This note was dictated with voice recognition software. Similar sounding words can inadvertently be transcribed and may not be corrected upon review.

## 2020-09-07 NOTE — Telephone Encounter (Signed)
I spoke with pt. I discharged pt and gave him the address to [facility] as directed by Dr. Marletta Lor. Pt asked me how far it was and I told pt it was about [address]. Pt stated he didn't have a car and would have transportation take him next week but would need an appointment. At checkout, pt states that the staff at [facility] know him and he would like our office to call them to schedule an appointment. I spoke with a nurse at [facility] and she stated they don't make appointments for pts. When I went to explain this to the pt, he had already left the office. I called pt and before we could discuss anything, pt said to take his name off and he didn't want to be a pt with our office. Pt states this office is rude. I said to pt, sir I don't feel I was rude and would like to help you. Pt said that's ok, take me off and I will go to my doctor.

## 2020-09-07 NOTE — Patient Instructions (Signed)
We will check your TTG IgA level today in regards to her celiac disease.  I would recommend that we perform EGD with small bowel biopsies to ensure resolution but I understand that you want to hold off for now.  Follow-up in 1 year or sooner if needed.  At [facility] we value your feedback. You may receive a survey about your visit today. Please share your experience as we strive to create trusting relationships with our patients to provide genuine, compassionate, quality care.  We appreciate your understanding and patience as we review any laboratory studies, imaging, and other diagnostic tests that are ordered as we care for you. Our office policy is 5 business days for review of these results, and any emergent or urgent results are addressed in a timely manner for your best interest. If you do not hear from our office in 1 week, please contact us.   We also encourage the use of MyChart, which contains your medical information for your review as well. If you are not enrolled in this feature, an access code is on this after visit summary for your convenience. Thank you for allowing [place] to be involved in your care.  It was great to see you today!  I hope you have a great rest of your spring!!    Hennie Duos. Marletta Lor, D.O. Gastroenterology and Hepatology [facility]

## 2020-09-07 NOTE — Telephone Encounter (Signed)
Patient called in stating he had just spoke with me. I advised him, I was not the one that answered earlier but what could I help him with. He stated "you know what you have been really un helpful and you need to take my name off as being a patient there". Misty Stanley then stated she was speaking with him but before I could advise him of that he then stated " don't call here and cancel everything. I don't want to be a patient there anymore". Phone was disconnected. FYI to Dr. Marletta Lor and Gray Bernhardt

## 2020-09-08 ENCOUNTER — Encounter (INDEPENDENT_AMBULATORY_CARE_PROVIDER_SITE_OTHER): Payer: Self-pay

## 2020-09-08 NOTE — Telephone Encounter (Signed)
Noted  

## 2020-09-14 ENCOUNTER — Other Ambulatory Visit (HOSPITAL_COMMUNITY)
Admission: RE | Admit: 2020-09-14 | Discharge: 2020-09-14 | Disposition: A | Discharge status: Home / Self Care | Payer: Medicare Other | Source: Ambulatory Visit | Attending: Internal Medicine | Admitting: Internal Medicine

## 2020-09-14 ENCOUNTER — Other Ambulatory Visit: Payer: Self-pay

## 2020-09-14 DIAGNOSIS — K900 Celiac disease: Secondary | ICD-10-CM | POA: Insufficient documentation

## 2020-09-15 ENCOUNTER — Telehealth: Payer: Self-pay

## 2020-09-15 LAB — TISSUE TRANSGLUTAMINASE, IGG: Tissue Transglut Ab: <2 U/mL (ref 0–5)

## 2020-09-15 LAB — TISSUE TRANSGLUTAMINASE, IGA: Tissue Transglutaminase Ab, IgA: 13 U/mL — ABNORMAL HIGH (ref 0–3)

## 2020-09-15 NOTE — Telephone Encounter (Signed)
Pt called wanting [place] to release his results from bloodwork done on yesterday. returned the pt's call and advised him that I haven't got his results either and we would not be the ones to release it to Mychart. Advised to call where he had blood work done.

## 2020-09-29 ENCOUNTER — Other Ambulatory Visit: Payer: Self-pay | Admitting: *Deleted

## 2020-09-29 DIAGNOSIS — I723 Aneurysm of iliac artery: Secondary | ICD-10-CM

## 2020-11-22 ENCOUNTER — Ambulatory Visit (INDEPENDENT_AMBULATORY_CARE_PROVIDER_SITE_OTHER): Payer: Medicare Other | Admitting: Vascular Surgery

## 2020-11-22 ENCOUNTER — Other Ambulatory Visit: Payer: Self-pay

## 2020-11-22 ENCOUNTER — Ambulatory Visit (HOSPITAL_COMMUNITY)
Admission: RE | Admit: 2020-11-22 | Discharge: 2020-11-22 | Disposition: A | Discharge status: Home / Self Care | Payer: Medicare Other | Source: Ambulatory Visit | Attending: Vascular Surgery | Admitting: Vascular Surgery

## 2020-11-22 DIAGNOSIS — I723 Aneurysm of iliac artery: Secondary | ICD-10-CM | POA: Diagnosis not present
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
## 2021-07-25 IMAGING — US US ABDOMEN LIMITED W/ ELASTOGRAPHY
1 series · 12 of 25 positions shown · non-contrast
Comparison: CT abdomen pelvis, 02/18/2020

CLINICAL DATA: Chronic liver disease, abnormal LFTs

EXAM:
US ABDOMEN LIMITED - RIGHT UPPER QUADRANT
ULTRASOUND HEPATIC ELASTOGRAPHY
TECHNIQUE: Sonography of the right upper quadrant was performed. In addition,
ultrasound elastography evaluation of the liver was performed. A
region of interest was placed within the right lobe of the liver.
Following application of a compressive sonographic pulse, tissue
compressibility was assessed. Multiple assessments were performed at
the selected site. Median tissue compressibility was determined.
Previously, hepatic stiffness was assessed by shear wave velocity.
Based on recently published Society of Radiologists in Ultrasound
consensus article, reporting is now recommended to be performed in
the SI units of pressure (kiloPascals) representing hepatic
stiffness/elasticity. The obtained result is compared to the
published reference standards. (cACLD = compensated Advanced Chronic
Liver Disease)

[Series 1: us abdomen ruq w/elastography · 12 of 37 slices shown]
[im 2/37]
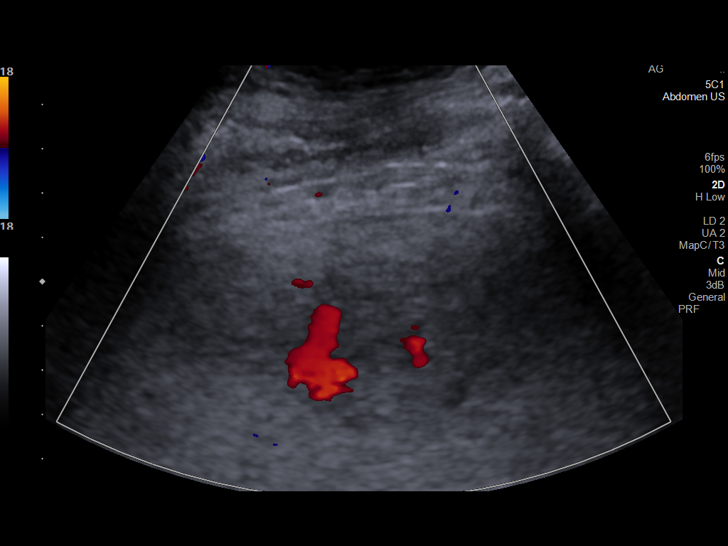
[im 5/37]
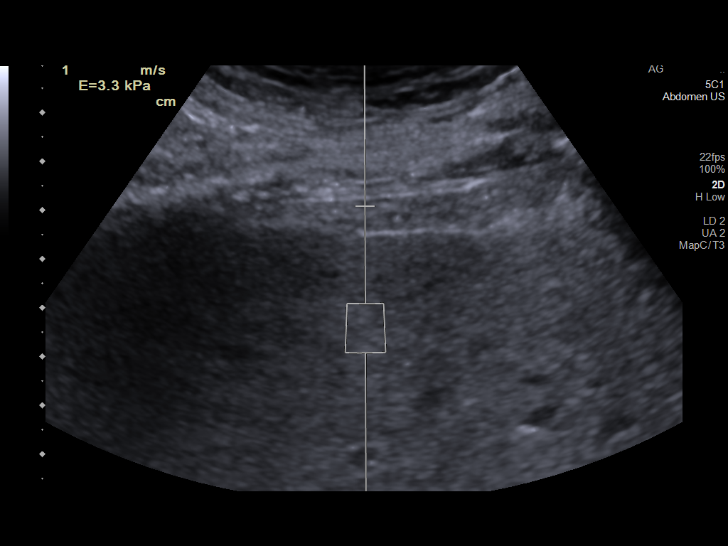
[im 8/37]
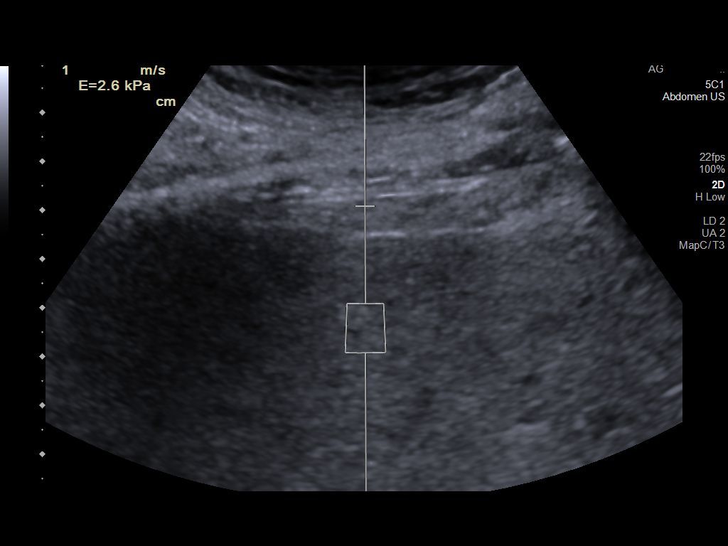
[im 11/37]
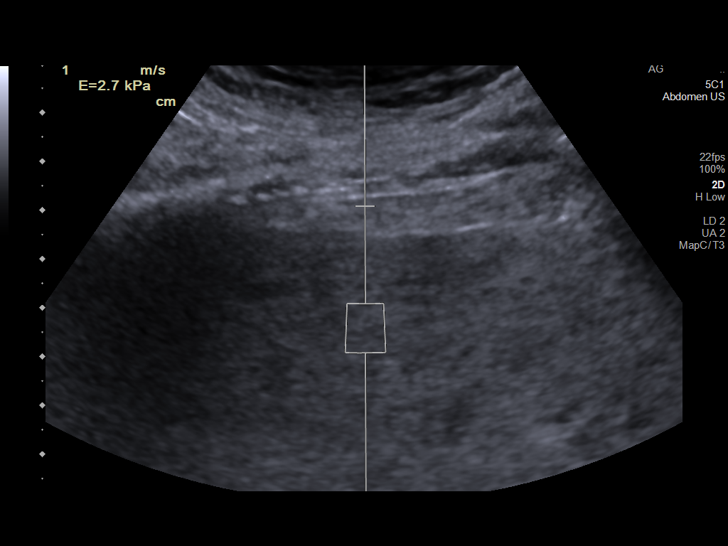
[im 14/37]
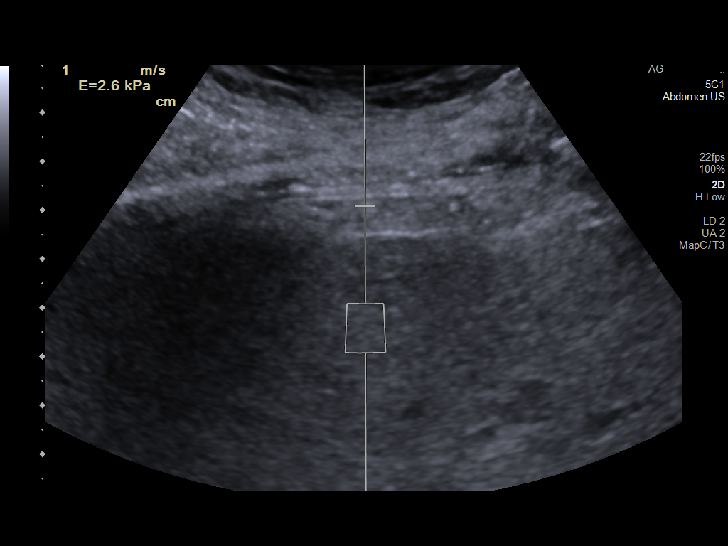
[im 17/37]
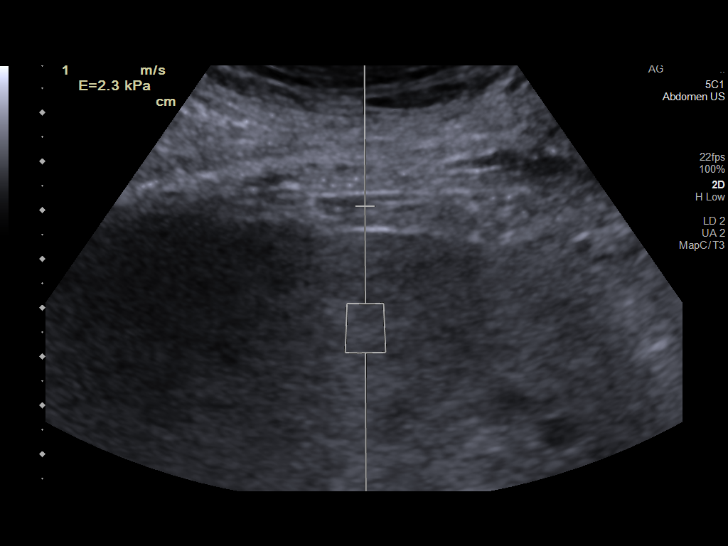
[im 20/37]
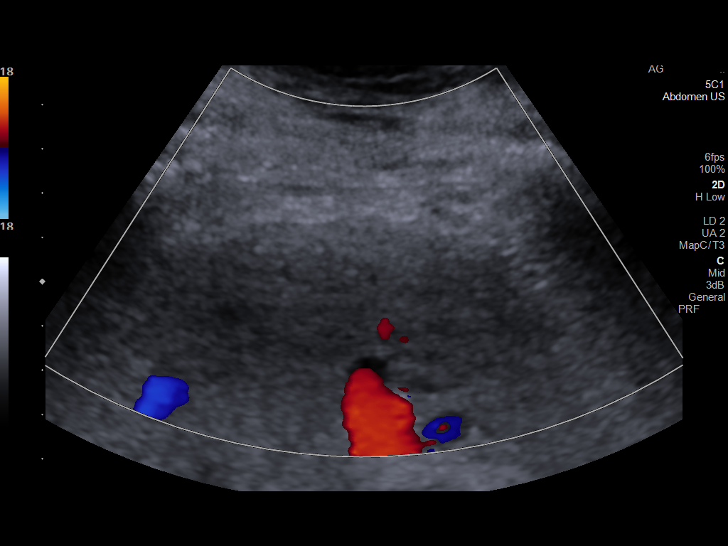
[im 23/37]
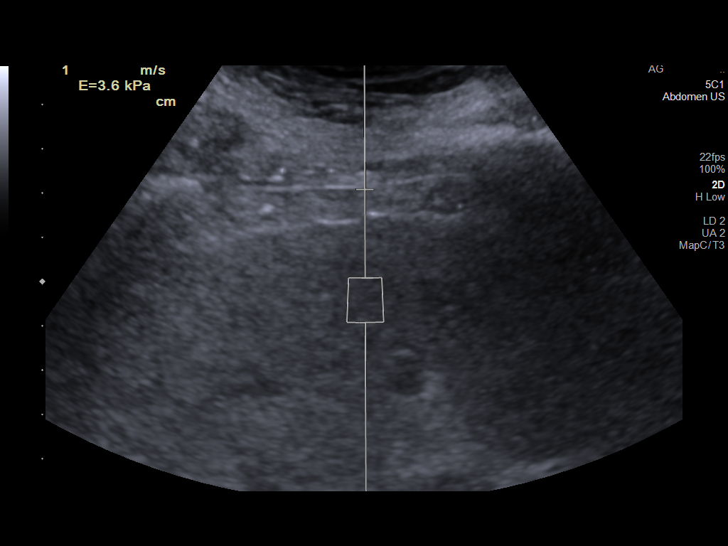
[im 26/37]
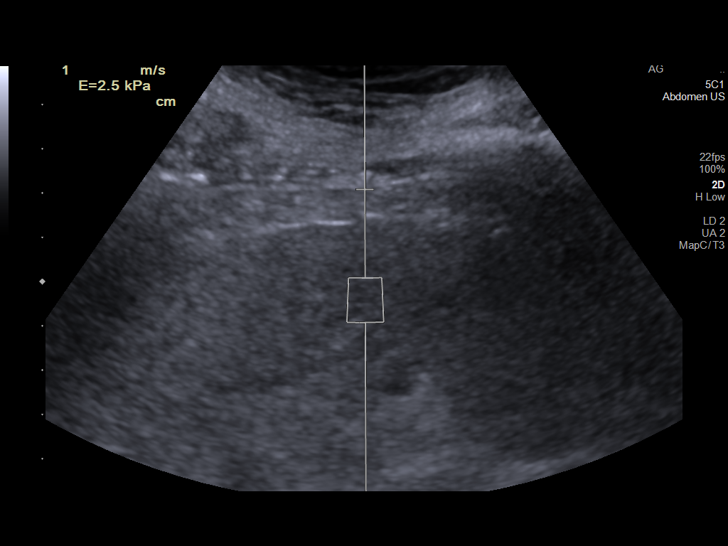
[im 29/37]
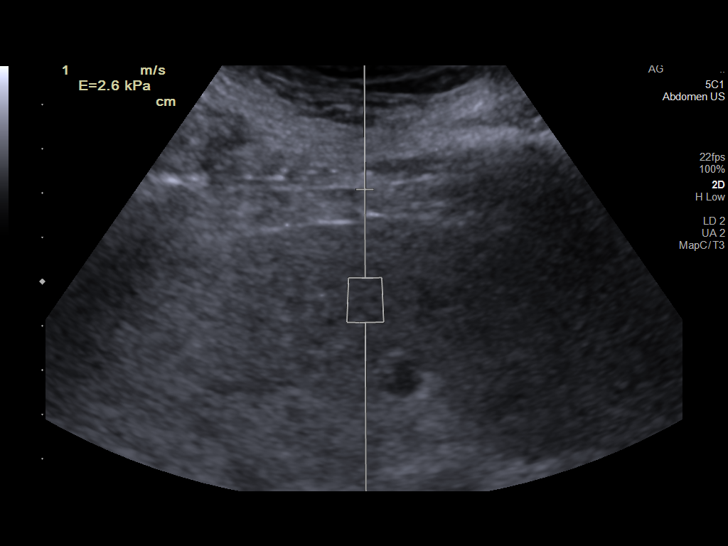
[im 32/37]
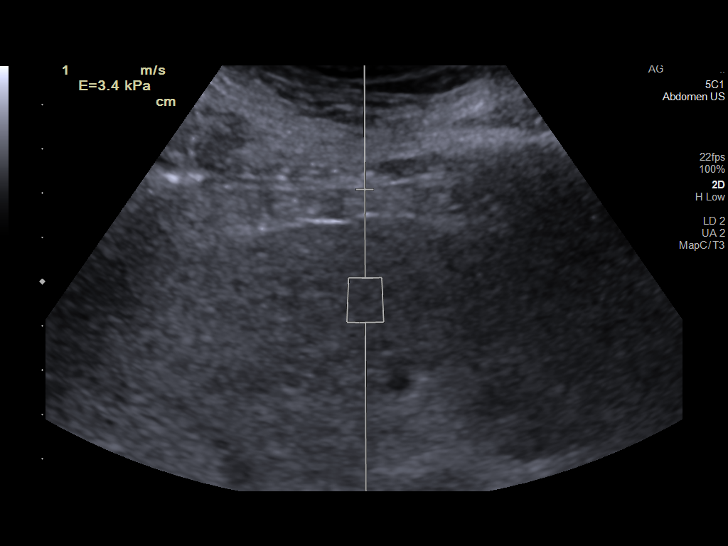
[im 35/37]
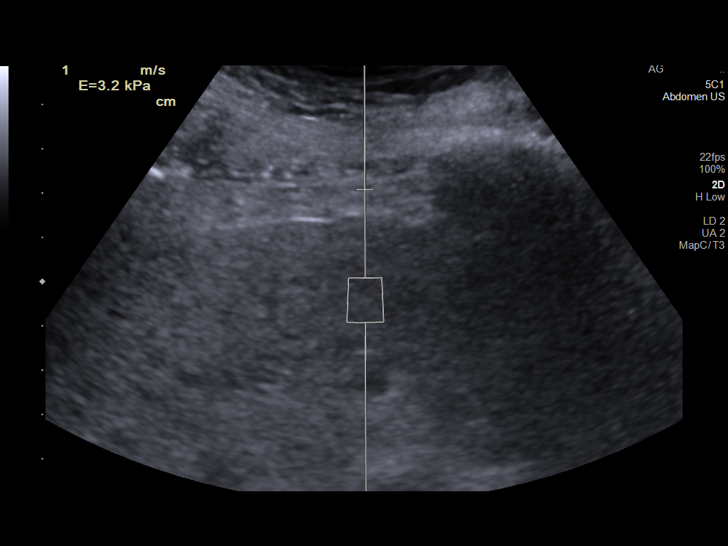

[12 of 25 positions shown; findings below may reference images not displayed]

FINDINGS: ULTRASOUND ABDOMEN LIMITED RIGHT UPPER QUADRANT

Gallbladder:

No gallstones or wall thickening visualized. No sonographic Murphy
sign noted.

Common bile duct:

Diameter: 2 mm

Liver:

Simple cyst demonstrating through transmission measuring 1.3 cm in
the left lobe, as seen on prior CT. No solid focal lesion
identified. Heterogeneous, nodular increased parenchymal
echogenicity. Portal vein is patent on color Doppler imaging with
normal direction of blood flow towards the liver.

ULTRASOUND HEPATIC ELASTOGRAPHY

Device: Siemens Helix VTQ

Patient position: Supine

Transducer 5C1

Number of measurements: 11

Hepatic segment:  8

Median kPa:

IQR:

IQR/Median kPa ratio:

Data quality:  Good

Diagnostic category:  < or = 5 kPa: high probability of being normal

The use of hepatic elastography is applicable to patients with viral
hepatitis and non-alcoholic fatty liver disease. At this time, there
is insufficient data for the referenced cut-off values and use in
other causes of liver disease, including alcoholic liver disease.
Patients, however, may be assessed by elastography and serve as
their own reference standard/baseline.

In patients with non-alcoholic liver disease, the values suggesting
compensated advanced chronic liver disease (cACLD) may be lower, and
patients may need additional testing with elasticity results of [DATE]
kPa.

Please note that abnormal hepatic elasticity and shear wave
velocities may also be identified in clinical settings other than
with hepatic fibrosis, such as: acute hepatitis, elevated right
heart and central venous pressures including use of beta blockers,
Railson disease (Chak Fung), infiltrative processes such as
mastocytosis/amyloidosis/infiltrative tumor/lymphoma, extrahepatic
cholestasis, with hyperemia in the post-prandial state, and with
liver transplantation. Correlation with patient history, laboratory
data, and clinical condition recommended.

Diagnostic Categories:

< or =5 kPa: high probability of being normal

< or =9 kPa: in the absence of other known clinical signs, rules [DATE] kPa and ?13 kPa: suggestive of cACLD, but needs further testing

>13 kPa: highly suggestive of cACLD

> or =17 kPa: highly suggestive of cACLD with an increased
probability of clinically significant portal hypertension
IMPRESSION: ULTRASOUND RUQ:

1. Heterogeneous, nodular contour and echotexture of the liver,
suggesting chronic liver disease. No focal liver lesion identified.

2.  Hepatic steatosis.

ULTRASOUND HEPATIC ELASTOGRAPHY:

Median kPa:

Diagnostic category:  < or = 5 kPa: high probability of being normal

## 2022-11-02 ENCOUNTER — Other Ambulatory Visit: Payer: Self-pay | Admitting: *Deleted

## 2022-11-02 DIAGNOSIS — I723 Aneurysm of iliac artery: Secondary | ICD-10-CM

## 2022-11-20 ENCOUNTER — Ambulatory Visit (HOSPITAL_COMMUNITY)
Admission: RE | Admit: 2022-11-20 | Discharge: 2022-11-20 | Disposition: A | Discharge status: Home / Self Care | Payer: Medicare Other | Source: Ambulatory Visit | Attending: Surgery | Admitting: Surgery

## 2022-11-20 ENCOUNTER — Ambulatory Visit (INDEPENDENT_AMBULATORY_CARE_PROVIDER_SITE_OTHER): Payer: Medicare Other | Admitting: Physician Assistant

## 2022-11-20 VITALS — BP 154/78 | HR 63 | Temp 98.1°F | Resp 18 | Ht 71.0 in | Wt 239.7 lb

## 2022-11-20 DIAGNOSIS — I723 Aneurysm of iliac artery: Secondary | ICD-10-CM | POA: Insufficient documentation

## 2022-11-20 NOTE — Progress Notes (Signed)
Office Note     CC:  follow up Requesting Provider:  Suzan Slick, MD  HPI: Alexander Dominguez is a 67 y.o. (1955-08-13) male who presents for follow up of left common iliac artery aneurysm. This was found incidentally and we have been monitoring it at 17 mm.  He denies any pain in his legs, back or abdomen. He does not have any difficulty walking, no pain at rest, no tissue loss.   Past Medical History:  Diagnosis Date   COPD (chronic obstructive pulmonary disease) (HCC)    Diabetes mellitus without complication (HCC)    Hypertension     No past surgical history on file.  Social History   Socioeconomic History   Marital status: Single    Spouse name: Not on file   Number of children: Not on file   Years of education: Not on file   Highest education level: Not on file  Occupational History   Not on file  Tobacco Use   Smoking status: Former   Smokeless tobacco: Never  Substance and Sexual Activity   Alcohol use: No   Drug use: No   Sexual activity: Not on file  Other Topics Concern   Not on file  Social History Narrative   Not on file   Social Determinants of Health   Financial Resource Strain: Low Risk  (08/08/2022)   Received from [facility], [facility]   Overall Financial Resource Strain (CARDIA)    Difficulty of Paying Living Expenses: Not hard at all  Food Insecurity: No Food Insecurity (08/08/2022)   Received from [facility], [facility]   Hunger Vital Sign    Worried About Running Out of Food in the Last Year: Never true    Ran Out of Food in the Last Year: Never true  Transportation Needs: No Transportation Needs (08/08/2022)   Received from [facility], [facility]   [facility] - Transportation    Lack of Transportation (Medical): No    Lack of Transportation (Non-Medical): No  Physical Activity: Not on file  Stress: No Stress Concern Present (08/08/2022)   Received from [facility], [facility]   [facility] of  Occupational Health - Occupational Stress Questionnaire    Feeling of Stress : Not at all  Social Connections: Unknown (09/13/2021)   Received from [facility]   Social Network    Social Network: Not on file  Intimate Partner Violence: Not At Risk (07/18/2022)   Received from [facility], [facility]   Humiliation, Afraid, Rape, and Kick questionnaire    Fear of Current or Ex-Partner: No    Emotionally Abused: No    Physically Abused: No    Sexually Abused: No    Family History  Problem Relation Age of Onset   Heart disease Mother    Heart disease Father    COPD Father     Current Outpatient Medications  Medication Sig Dispense Refill   insulin glargine (LANTUS) 100 UNIT/ML injection Inject 30 Units into the skin 2 (two) times daily.     amLODipine (NORVASC) 10 MG tablet Take 10 mg by mouth daily.     cholecalciferol (VITAMIN D3) 25 MCG (1000 UNIT) tablet Take 50,000 Units by mouth once a week.     glipiZIDE (GLUCOTROL) 10 MG tablet Take 10 mg by mouth daily.     lisinopril (ZESTRIL) 40 MG tablet Take 40 mg by mouth daily.     metoprolol succinate (  TOPROL-XL) 50 MG 24 hr tablet Take 50 mg by mouth in the morning and at bedtime. Take with or immediately following a meal.     No current facility-administered medications for this visit.    Allergies  Allergen Reactions   Other Cough, Itching and Shortness Of Breath   Metformin And Related Diarrhea     REVIEW OF SYSTEMS:  [X]  denotes positive finding, [ ]  denotes negative finding Cardiac  Comments:  Chest pain or chest pressure:    Shortness of breath upon exertion:    Short of breath when lying flat:    Irregular heart rhythm:        Vascular    Pain in calf, thigh, or hip brought on by ambulation:    Pain in feet at night that wakes you up from your sleep:     Blood clot in your veins:    Leg swelling:         Pulmonary    Oxygen at home:    Productive cough:     Wheezing:         Neurologic     Sudden weakness in arms or legs:     Sudden numbness in arms or legs:     Sudden onset of difficulty speaking or slurred speech:    Temporary loss of vision in one eye:     Problems with dizziness:         Gastrointestinal    Blood in stool:     Vomited blood:         Genitourinary    Burning when urinating:     Blood in urine:        Psychiatric    Major depression:         Hematologic    Bleeding problems:    Problems with blood clotting too easily:        Skin    Rashes or ulcers:        Constitutional    Fever or chills:      PHYSICAL EXAMINATION:  Vitals:   11/20/22 0955  BP: (!) 154/78  Pulse: 63  Resp: 18  Temp: 98.1 F (36.7 C)  TempSrc: Temporal  SpO2: 97%  Weight: 239 lb 11.2 oz (108.7 kg)  Height: 5\' 11"  (1.803 m)    General:  WDWN in NAD; vital signs documented above Gait: Normal HENT: WNL, normocephalic Pulmonary: normal non-labored breathing , without wheezing Cardiac: regular HR Abdomen: soft Vascular Exam/Pulses: 2+ femoral, 2+ popliteal and DP pulses Extremities: without ischemic changes, without Gangrene , without cellulitis; without open wounds;  Musculoskeletal: no muscle wasting or atrophy  Neurologic: A&O X 3 Psychiatric:  The pt has Normal affect.   Non-Invasive Vascular Imaging:   Abdominal Aorta Findings:  +-------------+-------+----------+----------+---------+--------+--------+  Location    AP (cm)Trans (cm)PSV (cm/s)Waveform ThrombusComments  +-------------+-------+----------+----------+---------+--------+--------+  Proximal    2.59   2.85      93                                   +-------------+-------+----------+----------+---------+--------+--------+  Mid         2.00   2.20      88                                   +-------------+-------+----------+----------+---------+--------+--------+  Distal  1.94   1.94      70                                    +-------------+-------+----------+----------+---------+--------+--------+  RT CIA Prox  1.5    1.5       71        biphasic                   +-------------+-------+----------+----------+---------+--------+--------+  RT CIA Mid   1.6    1.6       60        biphasic                   +-------------+-------+----------+----------+---------+--------+--------+  RT CIA Distal1.8    1.9       43        biphasic                   +-------------+-------+----------+----------+---------+--------+--------+  RT EIA Prox                   63        triphasic                  +-------------+-------+----------+----------+---------+--------+--------+  RT EIA Mid   1.5              58        triphasic                  +-------------+-------+----------+----------+---------+--------+--------+  RT EIA Distal                 93        triphasic                  +-------------+-------+----------+----------+---------+--------+--------+  LT CIA Prox  1.5    1.5       50        biphasic                   +-------------+-------+----------+----------+---------+--------+--------+  LT CIA Mid   1.3              76        biphasic                   +-------------+-------+----------+----------+---------+--------+--------+  LT CIA Distal                 69        triphasic                  +-------------+-------+----------+----------+---------+--------+--------+  LT EIA Prox                   61        biphasic                   +-------------+-------+----------+----------+---------+--------+--------+  LT EIA Mid   1.3              70        biphasic                   +-------------+-------+----------+----------+---------+--------+--------+  LT EIA Distal                           triphasic                  +-------------+-------+----------+----------+---------+--------+--------+  Summary:  Abdominal Aorta: No evidence of an  abdominal aortic aneurysm was visualized. Ectatic iliac arteries.   ASSESSMENT/PLAN:: 67 y.o. male here for follow up for left common iliac artery aneurysm.  He has no associated symptoms. His duplex today is overall stable. 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 The right is actually larger than the left with maximal diameter of 19 mm. No intervention is indicated at this time.  - There is no indication for intervention unless this becomes greater than 3 to 3.5 cm  - He will follow up again in 2 years with a repeat aortoiliac duplex   Graceann Congress, PA-C [facility] [phone]  Clinic MD:  [place]
